# Patient Record
Sex: Male | Born: 1995 | Race: White | Hispanic: No | Marital: Single
Health system: Southern US, Community
[De-identification: ages and names within clinical notes are randomized; demographics above are authoritative.]

---
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
## 2015-12-07 IMAGING — CR RIGHT HAND - COMPLETE 3+ VIEW
3 series · 3 of 3 positions shown · non-contrast
Comparison: None.

CLINICAL DATA: Fall skateboarding several days ago. Pain first and
fifth fingers

EXAM:
RIGHT HAND - COMPLETE 3+ VIEW

[hand ap]
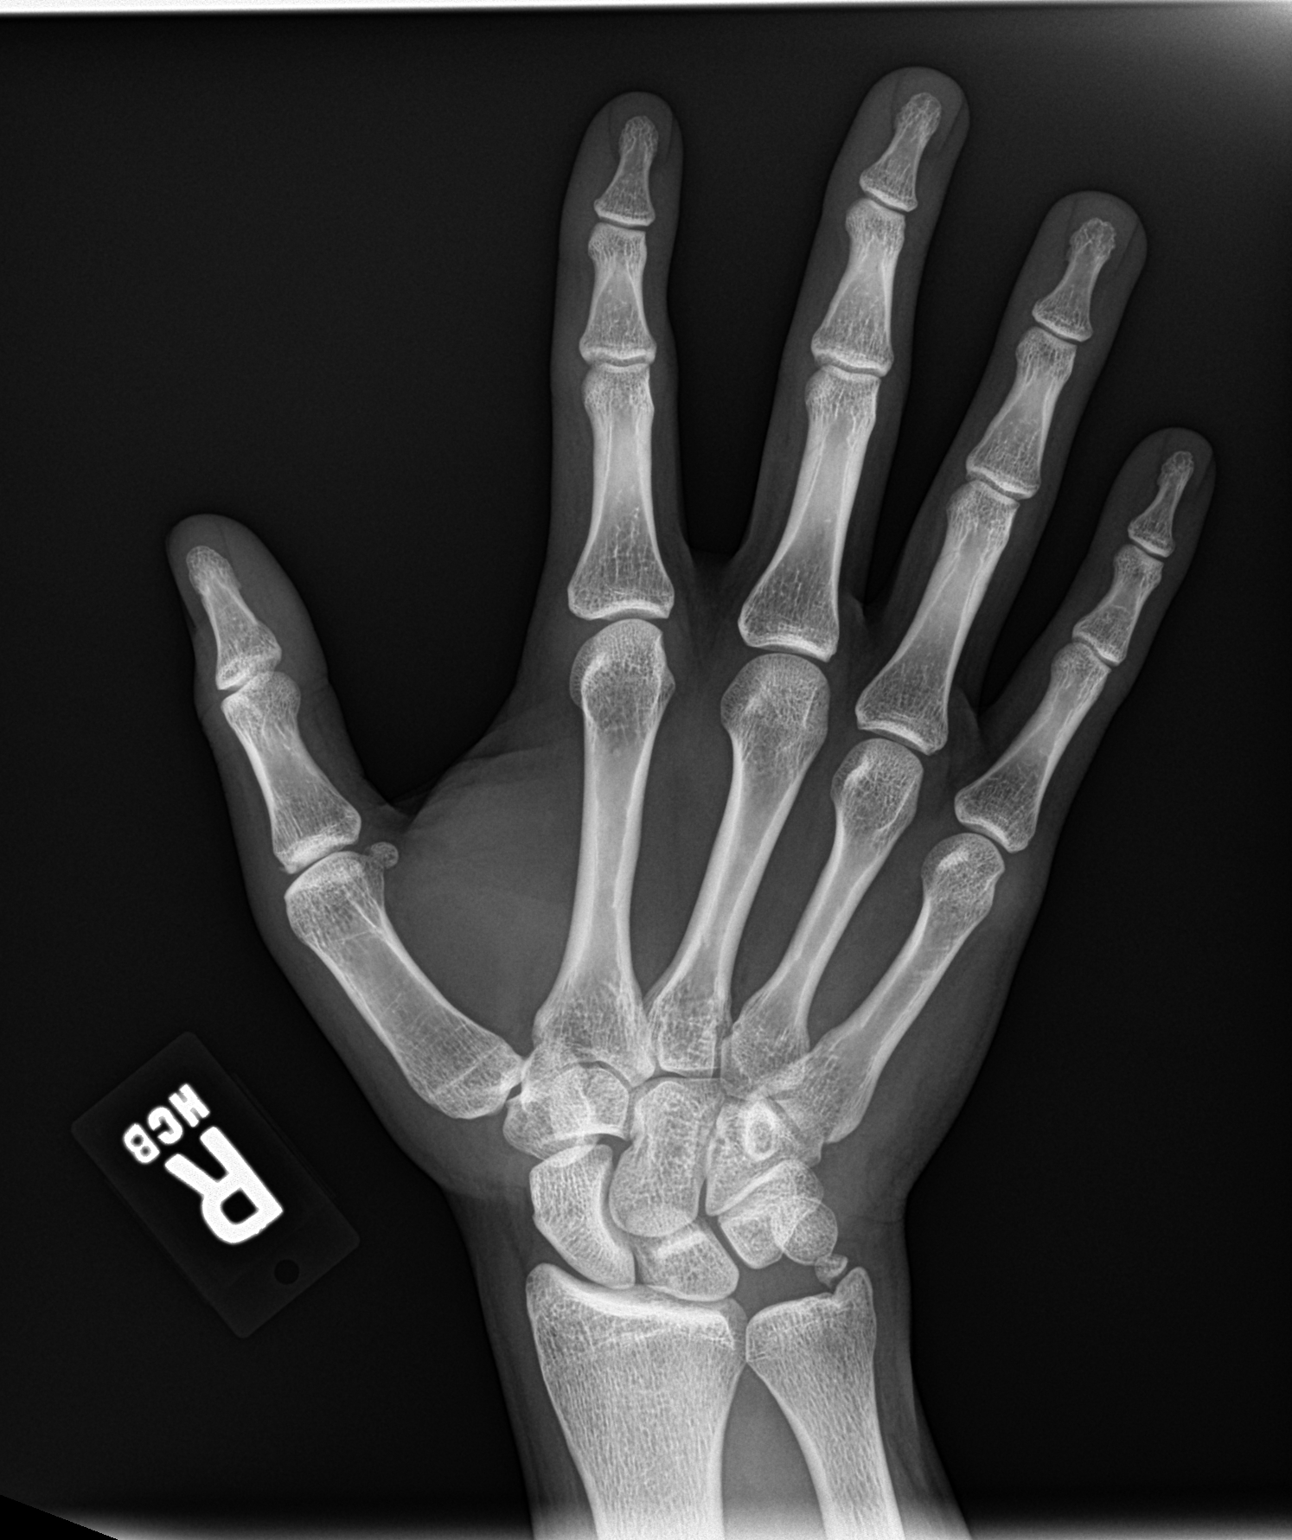

[hand obl]
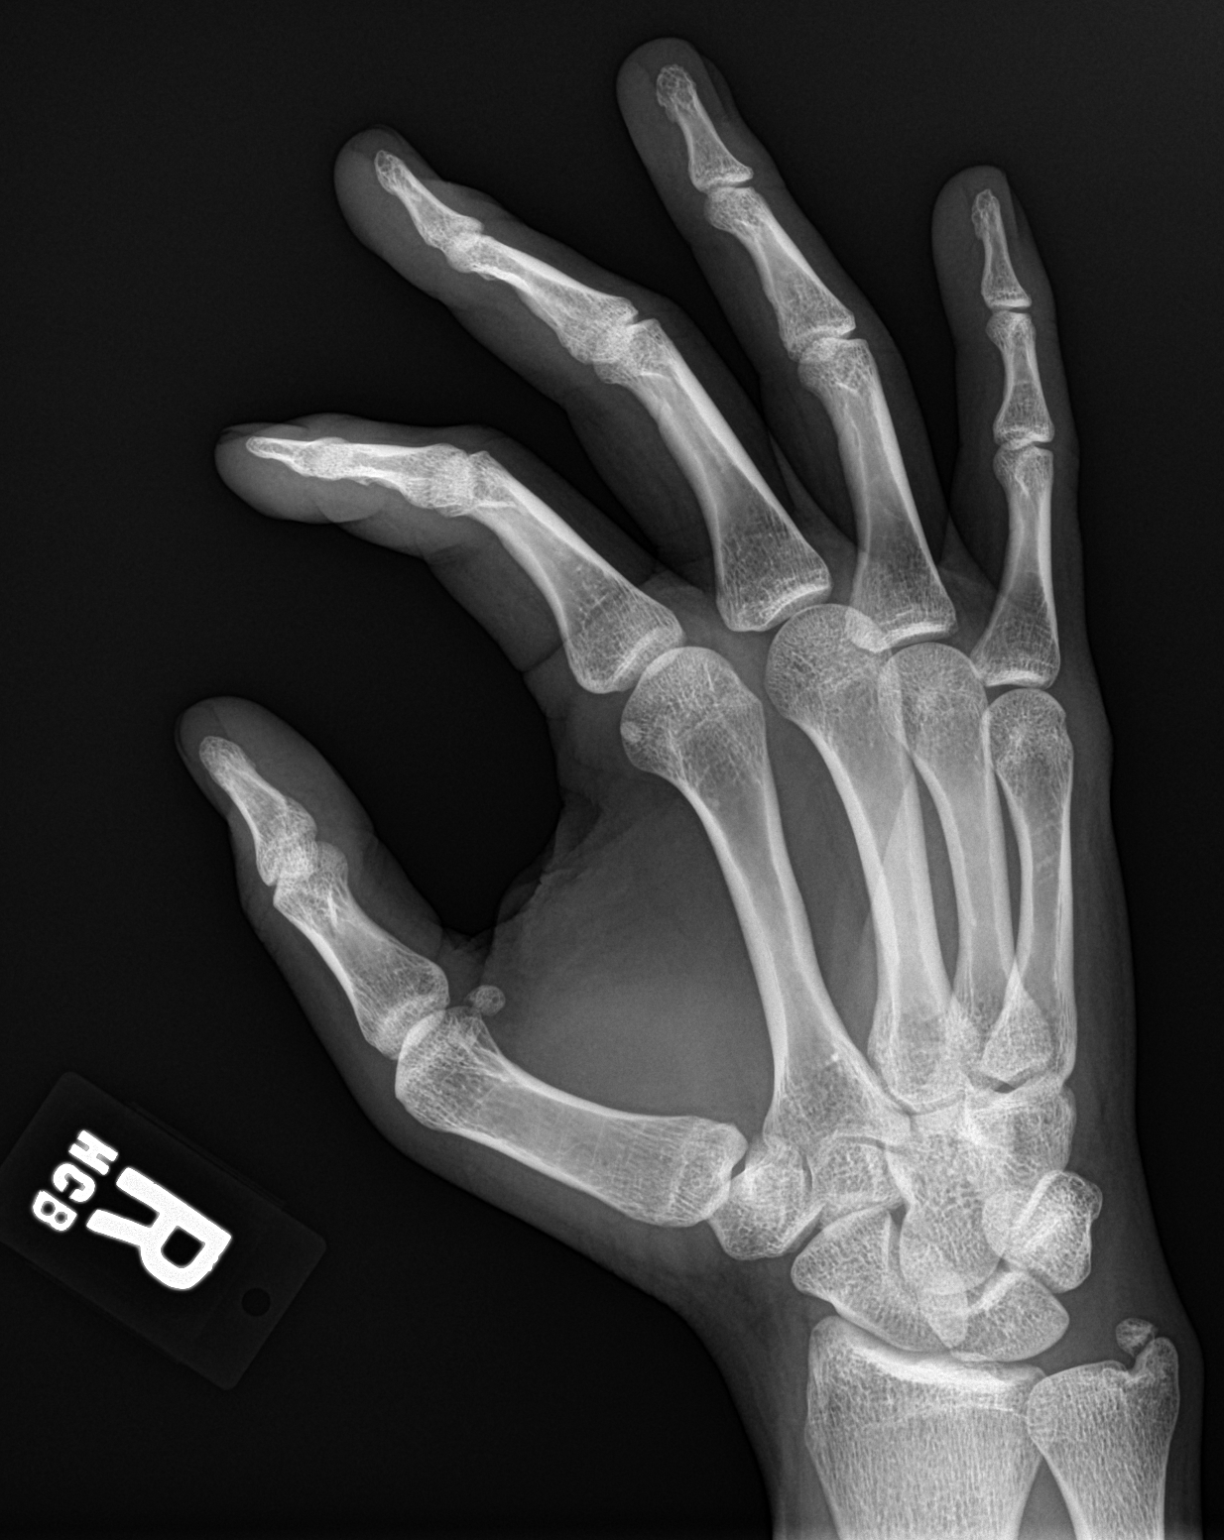

[hand lat]
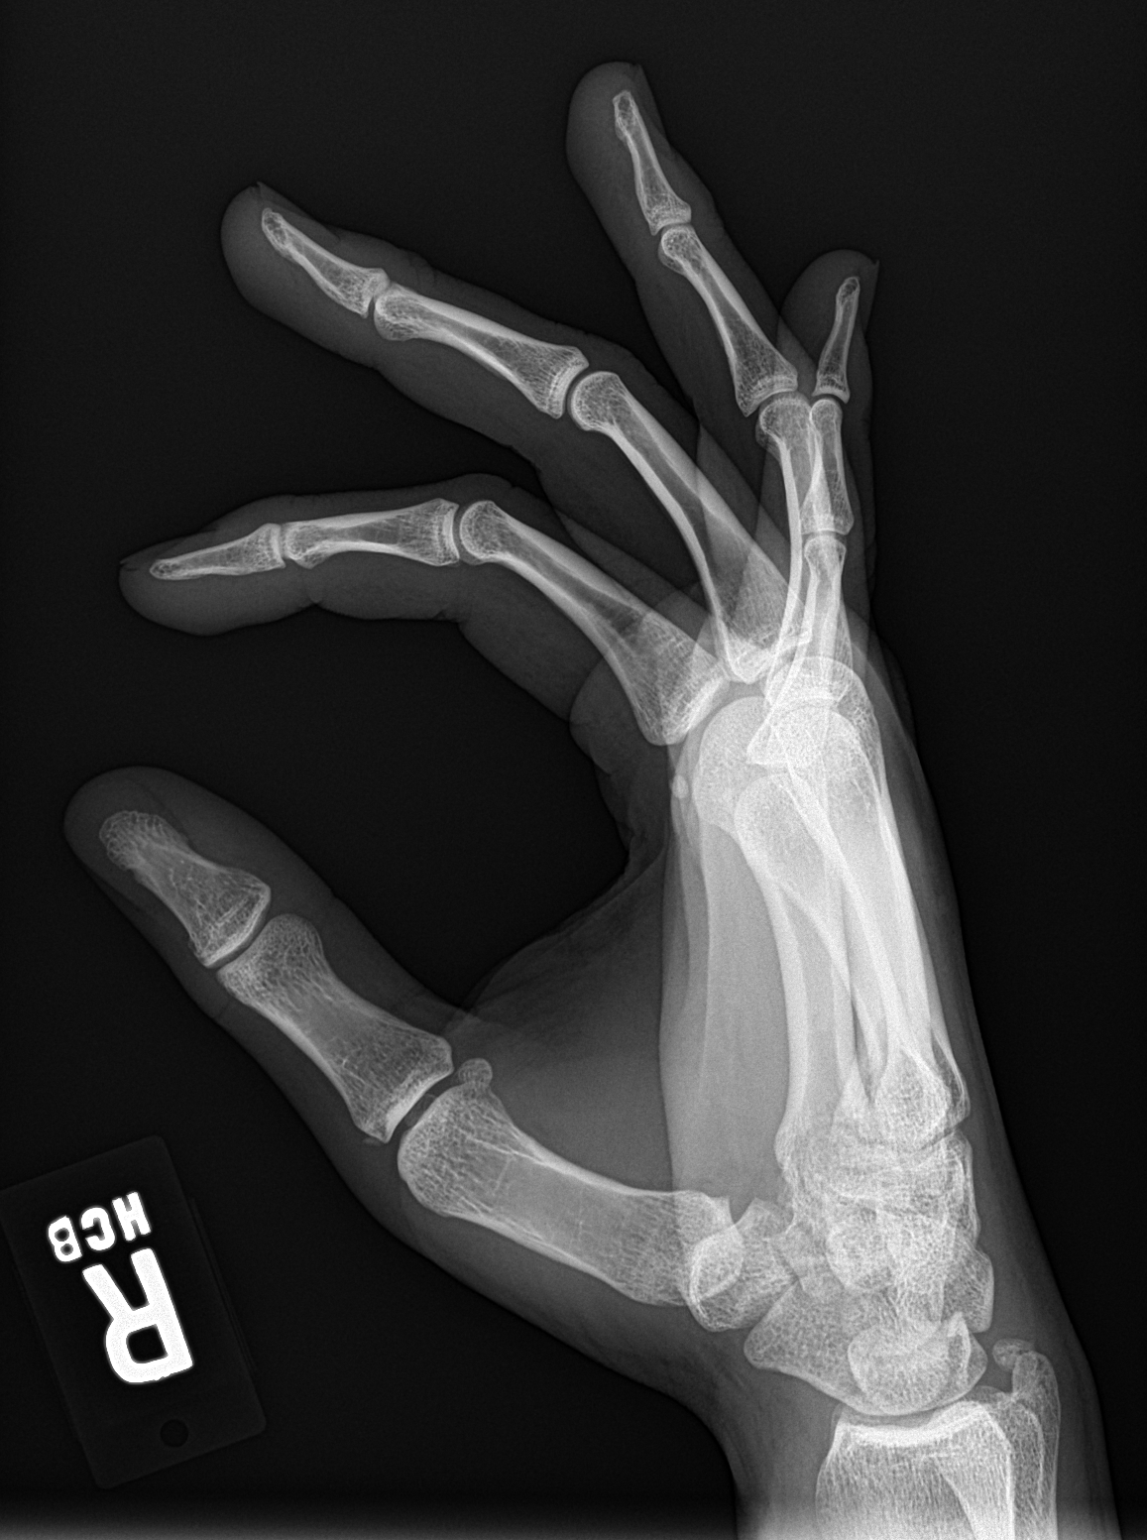

[3 of 3 positions shown; findings below may reference images not displayed]

FINDINGS: Chronic fracture ulnar styloid which has not healed with bony union.
This fracture was present on the prior study

Small bony fragment adjacent to the base of the first proximal
phalanx may be acute or chronic injury. No other acute fracture.
IMPRESSION: Chronic fracture ulnar styloid

Small avulsion fracture base of the first proximal phalanx of
indeterminate age.

## 2021-01-16 ENCOUNTER — Ambulatory Visit: Payer: 59 | Admitting: Dermatology

## 2021-01-16 ENCOUNTER — Other Ambulatory Visit: Payer: Self-pay

## 2021-01-16 DIAGNOSIS — L720 Epidermal cyst: Secondary | ICD-10-CM

## 2021-01-16 NOTE — Progress Notes (Signed)
   New Patient Visit  Subjective  Luke Lyons is a 25 y.o. male who presents for the following: New Patient (Initial Visit) (Patient reports a bump at back that has had for years. Patient states mother recommended someone look at bump. Patient denies any pain or itching at location. Patient denies family history of skin cancer. ).  The following portions of the chart were reviewed this encounter and updated as appropriate:   Allergies  Meds  Problems  Med Hx  Surg Hx  Fam Hx     Review of Systems:  No other skin or systemic complaints except as noted in HPI or Assessment and Plan.  Objective  Well appearing patient in no apparent distress; mood and affect are within normal limits.  A focused examination was performed including back. Relevant physical exam findings are noted in the Assessment and Plan.  Mid Back 1.1 cm Subcutaneous nodule.    Assessment & Plan  Epidermal inclusion cyst Mid Back  Benign-appearing. Exam most consistent with an epidermal inclusion cyst. Discussed that a cyst is a benign growth that can grow over time and sometimes get irritated or inflamed. Recommend observation if it is not bothersome. Discussed option of surgical excision to remove it if it is growing, symptomatic, or other changes noted. Please call for new or changing lesions so they can be evaluated.  Return for schedule for cyst removal at back.  IRuthell Rummage, CMA, am acting as scribe for Sarina Ser, MD. Documentation: I have reviewed the above documentation for accuracy and completeness, and I agree with the above.  Sarina Ser, MD

## 2021-01-16 NOTE — Patient Instructions (Addendum)
For cyst removal  at back  Pre-Operative Instructions  You are scheduled for a surgical procedure at [facility]. We recommend you read the following instructions. If you have any questions or concerns, please call the office at [phone].  Shower and wash the entire body with soap and water the day of your surgery paying special attention to cleansing at and around the planned surgery site.  Avoid aspirin or aspirin containing products at least fourteen (14) days prior to your surgical procedure and for at least one week (7 Days) after your surgical procedure. If you take aspirin on a regular basis for heart disease or history of stroke or for any other reason, we may recommend you continue taking aspirin but please notify us if you take this on a regular basis. Aspirin can cause more bleeding to occur during surgery as well as prolonged bleeding and bruising after surgery.   Avoid other nonsteroidal pain medications at least one week prior to surgery and at least one week prior to your surgery. These include medications such as Ibuprofen (Motrin, Advil and Nuprin), Naprosyn, Voltaren, Relafen, etc. If medications are used for therapeutic reasons, please inform us as they can cause increased bleeding or prolonged bleeding during and bruising after surgical procedures.   Please advise [place] if you are taking any "blood thinner" medications such as Coumadin or Dipyridamole or Plavix or similar medications. These cause increased bleeding and prolonged bleeding during procedures and bruising after surgical procedures. We may have to consider discontinuing these medications briefly prior to and shortly after your surgery if safe to do so.   Please inform us of all medications you are currently taking. All medications that are taken regularly should be taken the day of surgery as you always do. Nevertheless, we need to be informed of what medications you are taking prior to surgery to know  whether they will affect the procedure or cause any complications.   Please inform us of any medication allergies. Also inform us of whether you have allergies to Latex or rubber products or whether you have had any adverse reaction to Lidocaine or Epinephrine.  Please inform us of any prosthetic or artificial body parts such as artificial heart valve, joint replacements, etc., or similar condition that might require preoperative antibiotics.   We recommend avoidance of alcohol at least two weeks prior to surgery and continued avoidance for at least two weeks after surgery.   We recommend discontinuation of tobacco smoking at least two weeks prior to surgery and continued abstinence for at least two weeks after surgery.  Do not plan strenuous exercise, strenuous work or strenuous lifting for approximately four weeks after your surgery.   We request if you are unable to make your scheduled surgical appointment, please call us at least a week in advance or as soon as you are aware of a problem so that we can cancel or reschedule the appointment.   You MAY TAKE TYLENOL (acetaminophen) for pain as it is not a blood thinner.   PLEASE PLAN TO BE IN TOWN FOR TWO WEEKS FOLLOWING SURGERY, THIS IS IMPORTANT SO YOU CAN BE CHECKED FOR DRESSING CHANGES, SUTURE REMOVAL AND TO MONITOR FOR POSSIBLE COMPLICATIONS.         If You Need Anything After Your Visit  If you have any questions or concerns for your doctor, please call our main line at [phone] and press option 4 to reach your doctor's medical assistant. If no one answers, please leave a  voicemail as directed and we will return your call as soon as possible. Messages left after 4 pm will be answered the following business day.   You may also send [place] a message via [place]. We typically respond to MyChart messages within 1-2 business days.  For prescription refills, please ask your pharmacy to contact our office. Our fax number is  [phone].  If you have an urgent issue when the clinic is closed that cannot wait until the next business day, you can page your doctor at the number below.    Please note that while we do our best to be available for urgent issues outside of office hours, we are not available 24/7.   If you have an urgent issue and are unable to reach [place], you may choose to seek medical care at your doctor's office, retail clinic, urgent care center, or emergency room.  If you have a medical emergency, please immediately call 911 or go to the emergency department.  Pager Numbers  - Dr. Nehemiah Massed: [phone]  - Dr. Laurence Ferrari: [phone]  - Dr. Nicole Kindred: [phone]  In the event of inclement weather, please call our main line at [phone] for an update on the status of any delays or closures.  Dermatology Medication Tips: Please keep the boxes that topical medications come in in order to help keep track of the instructions about where and how to use these. Pharmacies typically print the medication instructions only on the boxes and not directly on the medication tubes.   If your medication is too expensive, please contact our office at [phone] option 4 or send [place] a message through [place].   We are unable to tell what your co-pay for medications will be in advance as this is different depending on your insurance coverage. However, we may be able to find a substitute medication at lower cost or fill out paperwork to get insurance to cover a needed medication.   If a prior authorization is required to get your medication covered by your insurance company, please allow [place] 1-2 business days to complete this process.  Drug prices often vary depending on where the prescription is filled and some pharmacies may offer cheaper prices.  The website www.goodrx.com contains coupons for medications through different pharmacies. The prices here do not account for what the cost may be with help from  insurance (it may be cheaper with your insurance), but the website can give you the price if you did not use any insurance.  - You can print the associated coupon and take it with your prescription to the pharmacy.  - You may also stop by our office during regular business hours and pick up a GoodRx coupon card.  - If you need your prescription sent electronically to a different pharmacy, notify our office through [facility] or by phone at [phone] option 4.     Si Usted Necesita Algo Despus de Su Visita  Tambin puede enviarnos un mensaje a travs de Pharmacist, community. Por lo general respondemos a los mensajes de MyChart en el transcurso de 1 a 2 das hbiles.  Para renovar recetas, por favor pida a su farmacia que se ponga en contacto con nuestra oficina. Harland Dingwall de fax es [place] [phone].  Si tiene un asunto urgente cuando la clnica est cerrada y que no puede esperar hasta el siguiente da hbil, puede llamar/localizar a su doctor(a) al nmero que aparece a continuacin.   Por favor, tenga en cuenta que aunque hacemos todo lo  posible para estar disponibles para asuntos urgentes fuera del horario de oficina, no estamos disponibles las 24 horas del da, los 7 das de la [place].   Si tiene un problema urgente y no puede comunicarse con nosotros, puede optar por buscar atencin mdica  en el consultorio de su doctor(a), en una clnica privada, en un centro de atencin urgente o en una sala de emergencias.  Si tiene Engineering geologist, por favor llame inmediatamente al 911 o vaya a la sala de emergencias.  Nmeros de bper  - Dr. Nehemiah Massed: [phone]  - Dra. Moye: [phone]  - Dra. Nicole Kindred: [phone]  En caso de inclemencias del [place], por favor llame a Johnsie Kindred principal al [phone] para una actualizacin sobre el [place] de cualquier retraso o cierre.  Consejos para la medicacin en dermatologa: Por favor, guarde las cajas en las que vienen los  medicamentos de uso tpico para ayudarle a seguir las instrucciones sobre dnde y cmo usarlos. Las farmacias generalmente imprimen las instrucciones del medicamento slo en las cajas y no directamente en los tubos del [place].   Si su medicamento es muy caro, por favor, pngase en contacto con Zigmund Daniel llamando al [phone] y presione la opcin 4 o envenos un mensaje a travs de Pharmacist, community.   No podemos decirle cul ser su copago por los medicamentos por adelantado ya que esto es diferente dependiendo de la cobertura de su seguro. Sin embargo, es posible que podamos encontrar un medicamento sustituto a Electrical engineer un formulario para que el seguro cubra el medicamento que se considera necesario.   Si se requiere una autorizacin previa para que su compaa de seguros [place] su medicamento, por favor permtanos de 1 a 2 das hbiles para completar este proceso.  Los precios de los medicamentos varan con frecuencia dependiendo del Environmental consultant de dnde se surte la receta y alguna farmacias pueden ofrecer precios ms baratos.  El sitio web www.goodrx.com tiene cupones para medicamentos de Airline pilot. Los precios aqu no tienen en cuenta lo que podra costar con la ayuda del seguro (puede ser ms barato con su seguro), pero el sitio web puede darle el precio si no utiliz Research scientist (physical sciences).  - Puede imprimir el cupn correspondiente y llevarlo con su receta a la farmacia.  - Tambin puede pasar por nuestra oficina durante el horario de atencin regular y Charity fundraiser una tarjeta de cupones de GoodRx.  - Si necesita que su receta se enve electrnicamente a una farmacia diferente, informe a nuestra oficina a travs de MyChart de [place] o por telfono llamando al [phone] y presione la opcin 4.

## 2021-01-21 ENCOUNTER — Encounter: Payer: Self-pay | Admitting: Dermatology

## 2021-02-28 ENCOUNTER — Ambulatory Visit (INDEPENDENT_AMBULATORY_CARE_PROVIDER_SITE_OTHER): Payer: Managed Care, Other (non HMO) | Admitting: Dermatology

## 2021-02-28 ENCOUNTER — Telehealth: Payer: Self-pay

## 2021-02-28 ENCOUNTER — Other Ambulatory Visit: Payer: Self-pay

## 2021-02-28 DIAGNOSIS — D485 Neoplasm of uncertain behavior of skin: Secondary | ICD-10-CM

## 2021-02-28 DIAGNOSIS — L720 Epidermal cyst: Secondary | ICD-10-CM | POA: Diagnosis not present

## 2021-02-28 MED ORDER — MUPIROCIN 2 % EX OINT: 1.0000 "application " | TOPICAL_OINTMENT | Freq: Every day | CUTANEOUS | 0 refills | Status: AC

## 2021-02-28 NOTE — Telephone Encounter (Signed)
Left pt msg to call if any problems after today's surgery./sh °

## 2021-02-28 NOTE — Patient Instructions (Signed)

## 2021-02-28 NOTE — Progress Notes (Signed)
° °  Follow-Up Visit   Subjective  Luke Lyons is a 26 y.o. male who presents for the following: Cyst (Mid back - excise today).  The following portions of the chart were reviewed this encounter and updated as appropriate:   Allergies   Meds   Problems   Med Hx   Surg Hx   Fam Hx      Review of Systems:  No other skin or systemic complaints except as noted in HPI or Assessment and Plan.  Objective  Well appearing patient in no apparent distress; mood and affect are within normal limits.  A focused examination was performed including back. Relevant physical exam findings are noted in the Assessment and Plan.  Mid Back 2.1 cm cystic papule   Assessment & Plan  Neoplasm of uncertain behavior of skin -suspected changing symptomatic cyst Mid Back  Skin excision  Lesion length (cm):  2.1 Lesion width (cm):  2.1 Margin per side (cm):  0 Total excision diameter (cm):  2.1 Informed consent: discussed and consent obtained   Timeout: patient name, date of birth, surgical site, and procedure verified   Procedure prep:  Patient was prepped and draped in usual sterile fashion Prep type:  Isopropyl alcohol and povidone-iodine Anesthesia: the lesion was anesthetized in a standard fashion   Anesthetic:  1% lidocaine w/ epinephrine 1-100,000 buffered w/ 8.4% NaHCO3 Instrument used: #15 blade   Hemostasis achieved with: pressure   Hemostasis achieved with comment:  Electrocautery Outcome: patient tolerated procedure well with no complications   Post-procedure details: sterile dressing applied and wound care instructions given   Dressing type: bandage and pressure dressing (mupirocin)    Skin repair Complexity:  Complex Final length (cm):  3 Reason for type of repair: reduce tension to allow closure, reduce the risk of dehiscence, infection, and necrosis, reduce subcutaneous dead space and avoid a hematoma, allow closure of the large defect, preserve normal anatomy, preserve normal  anatomical and functional relationships and enhance both functionality and cosmetic results   Undermining: area extensively undermined   Undermining comment:  Undermining defect 2.1 cm Subcutaneous layers (deep stitches):  Suture size:  2-0 Suture type: Vicryl (polyglactin 910)   Subcutaneous suture technique: inverted dermal. Fine/surface layer approximation (top stitches):  Suture type: nylon   Stitches: simple running   Suture removal (days):  7 Hemostasis achieved with: suture and pressure Outcome: patient tolerated procedure well with no complications   Post-procedure details: sterile dressing applied and wound care instructions given   Dressing type: bandage and pressure dressing (mupirocin)    mupirocin ointment (BACTROBAN) 2 % Apply 1 application topically daily. With dressing changes  Specimen 1 - Surgical pathology Differential Diagnosis: Cyst vs other  Check Margins: No  Return in about 1 week (around 03/07/2021) for suture removal.  I, Ashok Cordia, CMA, am acting as scribe for Sarina Ser, MD . Documentation: I have reviewed the above documentation for accuracy and completeness, and I agree with the above.  Sarina Ser, MD

## 2021-03-05 ENCOUNTER — Encounter: Payer: Self-pay | Admitting: Dermatology

## 2021-03-07 ENCOUNTER — Ambulatory Visit (INDEPENDENT_AMBULATORY_CARE_PROVIDER_SITE_OTHER): Payer: Managed Care, Other (non HMO) | Admitting: Dermatology

## 2021-03-07 ENCOUNTER — Telehealth: Payer: Self-pay

## 2021-03-07 ENCOUNTER — Other Ambulatory Visit: Payer: Self-pay

## 2021-03-07 DIAGNOSIS — Z4802 Encounter for removal of sutures: Secondary | ICD-10-CM

## 2021-03-07 DIAGNOSIS — L905 Scar conditions and fibrosis of skin: Secondary | ICD-10-CM

## 2021-03-07 NOTE — Telephone Encounter (Signed)
error 

## 2021-03-07 NOTE — Patient Instructions (Signed)

## 2021-03-07 NOTE — Progress Notes (Signed)
° °  Follow-Up Visit   Subjective  Luke Lyons is a 26 y.o. male who presents for the following: Suture / Staple Removal (1 week f/u suture removal mid back EPIDERMOID CYST).  The following portions of the chart were reviewed this encounter and updated as appropriate:   Allergies   Meds   Problems   Med Hx   Surg Hx   Fam Hx       Review of Systems:  No other skin or systemic complaints except as noted in HPI or Assessment and Plan.  Objective  Well appearing patient in no apparent distress; mood and affect are within normal limits.  A focused examination was performed including back . Relevant physical exam findings are noted in the Assessment and Plan.  Mid Back Dyspigmented smooth macule   Assessment & Plan  Cyst - benign - post op Mid Back  Discussed biopsy results showed EPIDERMOID CYST  Encounter for Removal of Sutures - Incision site at the mid back is clean, dry and intact - Wound cleansed, sutures removed, wound cleansed and steri strips applied.  - Discussed pathology results showing EPIDERMOID CYST  - Patient advised to keep steri-strips dry until they fall off. - Scars remodel for a full year. - Once steri-strips fall off, patient can apply over-the-counter silicone scar cream each night to help with scar remodeling if desired. - Patient advised to call with any concerns or if they notice any new or changing lesions.   Return if symptoms worsen or fail to improve.  IMarye Round, CMA, am acting as scribe for Sarina Ser, MD .  Documentation: I have reviewed the above documentation for accuracy and completeness, and I agree with the above.  Sarina Ser, MD

## 2021-03-09 ENCOUNTER — Encounter: Payer: Self-pay | Admitting: Dermatology

## 2021-03-23 ENCOUNTER — Ambulatory Visit: Payer: Self-pay | Admitting: Dermatology
